# Patient Record
Sex: Male | Born: 1990 | Race: Black or African American | Hispanic: No | Marital: Single | Smoking: Never smoker
Health system: Southern US, Community
[De-identification: ages and names within clinical notes are randomized; demographics above are authoritative.]

## PROBLEM LIST (undated history)

## (undated) DIAGNOSIS — T7840XA Allergy, unspecified, initial encounter: Secondary | ICD-10-CM

## (undated) DIAGNOSIS — R011 Cardiac murmur, unspecified: Secondary | ICD-10-CM

## (undated) DIAGNOSIS — J45909 Unspecified asthma, uncomplicated: Secondary | ICD-10-CM

## (undated) HISTORY — DX: Cardiac murmur, unspecified: R01.1

## (undated) HISTORY — DX: Allergy, unspecified, initial encounter: T78.40XA

## (undated) HISTORY — DX: Unspecified asthma, uncomplicated: J45.909

---

## 2013-03-05 ENCOUNTER — Ambulatory Visit: Payer: BC Managed Care – PPO | Admitting: Physician Assistant

## 2013-03-05 ENCOUNTER — Encounter: Payer: Self-pay | Admitting: Physician Assistant

## 2013-03-05 VITALS — BP 120/68 | HR 59 | Temp 98.0°F | Resp 18 | Ht 72.0 in | Wt 171.2 lb

## 2013-03-05 DIAGNOSIS — L42 Pityriasis rosea: Secondary | ICD-10-CM

## 2013-03-05 DIAGNOSIS — R21 Rash and other nonspecific skin eruption: Secondary | ICD-10-CM

## 2013-03-05 MED ORDER — TRIAMCINOLONE ACETONIDE 0.1 % EX CREA: TOPICAL_CREAM | CUTANEOUS | Status: DC

## 2013-03-05 NOTE — Progress Notes (Signed)
Patient ID: Darrell Ross MRN: [id number], DOB: 04/27/1991, 22 y.o. Date of Encounter: 03/05/2013, 6:35 PM  Primary Physician: No primary provider on file.  Chief Complaint: Rash x 1 week  HPI: 22 y.o. male with history below presents with a 1 week history of a pruritic rash along the anterior and posterior trunk, bilateral arms, and inner thighs. Afebrile. No chills. Patient states that 1 month ago he did start a new laundry detergent, but he has since changed back to his usual laundry detergent and the rash persists. He also started using a new bath soap around 1 month ago, but he has since changed back to a known soap and the rash persists. No new medications, foods, or clothing. He did recently transfer into a new dorm. He does not recall seeing a herald spot but he states he cannot see his back very well.     Past Medical History  Diagnosis Date  . Allergy   . Asthma   . Heart murmur      Home Meds: Prior to Admission medications   Not on File    Allergies:  Allergies  Allergen Reactions  . Penicillins     History   Social History  . Marital Status: Single    Spouse Name: N/A    Number of Children: N/A  . Years of Education: N/A   Occupational History  . Not on file.   Social History Main Topics  . Smoking status: Never Smoker   . Smokeless tobacco: Not on file  . Alcohol Use: Not on file  . Drug Use: Not on file  . Sexual Activity: Not on file   Other Topics Concern  . Not on file   Social History Narrative  . No narrative on file     Review of Systems: Constitutional: negative for chills, fever, or fatigue  HEENT: positive for nasal congestion and rhinorrhea. negative for vision changes, hearing loss, ST, or sinus pressure Cardiovascular: negative for chest pain or palpitations Respiratory: negative for wheezing, shortness of breath, or cough Abdominal: negative for abdominal pain, nausea, vomiting, or diarrhea Dermatological: positive for  rash Neurologic: negative for headache   Physical Exam: Blood pressure 120/68, pulse 59, temperature 98 F (36.7 C), temperature source Oral, resp. rate 18, height 6' (1.829 m), weight 171 lb 3.2 oz (77.656 kg), SpO2 100.00%., Body mass index is 23.21 kg/(m^2). General: Well developed, well nourished, in no acute distress. Head: Normocephalic, atraumatic, eyes without discharge, sclera non-icteric, nares are without discharge.   Neck: Supple. Full ROM.  Lungs: Breathing is unlabored. Heart: Regular rate. Msk:  Strength and tone normal for age. Extremities/Skin: Warm and dry. No clubbing or cyanosis. No edema. Multiple scaling papules along the anterior trunk, posterior trunk, bilateral arms, and inner thighs. No lesions along the lower legs. No secondary infections. No lesions along the palms of the hands.  Neuro: Alert and oriented X 3. Moves all extremities spontaneously. Gait is normal. CNII-XII grossly in tact. Psych:  Responds to questions appropriately with a normal affect.   Labs: Results for orders placed in visit on 03/05/13  POCT SKIN KOH      Result Value Range   Skin KOH, POC Negative       ASSESSMENT AND PLAN:  22 y.o. male with pityriasis rosea and pruritis  -Triamcinolone topical cream 0.1% Apply to affected area bid #45 grams no RF -Benadryl prn -Education provided  Signed, Eula Listen, PA-C [facility] [place], [place] [address]  [phone] 03/05/2013 6:35 PM

## 2017-04-01 ENCOUNTER — Ambulatory Visit (HOSPITAL_COMMUNITY)
Admission: EM | Admit: 2017-04-01 | Discharge: 2017-04-01 | Disposition: A | Discharge status: Home / Self Care | Payer: BC Managed Care – PPO | Attending: Internal Medicine | Admitting: Internal Medicine

## 2017-04-01 ENCOUNTER — Encounter (HOSPITAL_COMMUNITY): Payer: Self-pay | Admitting: Emergency Medicine

## 2017-04-01 ENCOUNTER — Ambulatory Visit (INDEPENDENT_AMBULATORY_CARE_PROVIDER_SITE_OTHER): Payer: BC Managed Care – PPO

## 2017-04-01 ENCOUNTER — Other Ambulatory Visit: Payer: Self-pay

## 2017-04-01 DIAGNOSIS — M25572 Pain in left ankle and joints of left foot: Secondary | ICD-10-CM

## 2017-04-01 MED ORDER — NAPROXEN 500 MG PO TABS: 500.0000 mg | ORAL_TABLET | Freq: Two times a day (BID) | ORAL | 0 refills | Status: AC

## 2017-04-01 NOTE — ED Provider Notes (Signed)
[facility]    CSN: [id number] Arrival date & time: 04/01/17  1246     History   Chief Complaint Chief Complaint  Patient presents with  . Ankle Pain    HPI Darrell Ross is a 26 y.o. male.   26 year old male comes in for 1 week history of left ankle pain. States inverted ankle while playing basketball. He was able to ambulate after incident. Has swelling around the ankle. Continues to weight bear without problems. States has been using ice compress with good improvement of swelling. Denies numbness, tingling. Has not taken anything for the pain.       Past Medical History:  Diagnosis Date  . Allergy   . Asthma   . Heart murmur     There are no active problems to display for this patient.   History reviewed. No pertinent surgical history.     Home Medications    Prior to Admission medications   Medication Sig Start Date End Date Taking? Authorizing Provider  naproxen (NAPROSYN) 500 MG tablet Take 1 tablet (500 mg total) by mouth 2 (two) times daily for 10 days. 04/01/17 04/11/17  Belinda FisherYu, Amy V, PA-C    Family History Family History  Problem Relation Age of Onset  . Asthma Father     Social History Social History   Tobacco Use  . Smoking status: Never Smoker  Substance Use Topics  . Alcohol use: Not on file  . Drug use: Not on file     Allergies   Penicillins   Review of Systems Review of Systems  Reason unable to perform ROS: See HPI as above.     Physical Exam Triage Vital Signs ED Triage Vitals [04/01/17 1329]  Enc Vitals Group     BP 128/64     Pulse Rate 60     Resp 16     Temp 97.9 F (36.6 C)     Temp src      SpO2 100 %     Weight      Height      Head Circumference      Peak Flow      Pain Score 3     Pain Loc      Pain Edu?      Excl. in GC?    No data found.  Updated Vital Signs BP 128/64   Pulse 60   Temp 97.9 F (36.6 C)   Resp 16   SpO2 100%   Physical Exam  Constitutional: He is oriented  to person, place, and time. He appears well-developed and well-nourished. No distress.  HENT:  Head: Normocephalic and atraumatic.  Eyes: Conjunctivae are normal. Pupils are equal, round, and reactive to light.  Musculoskeletal:  Tenderness on palpation inferior to medial malleolus with mild swelling. Full ROM of ankle. Strength normal and equal bilaterally. Sensation intact and equal bilaterally. Pedal pulses 2+.  Neurological: He is alert and oriented to person, place, and time.     UC Treatments / Results  Labs (all labs ordered are listed, but only abnormal results are displayed) Labs Reviewed - No data to display  EKG  EKG Interpretation None       Radiology Dg Ankle Complete Left  Result Date: 04/01/2017 CLINICAL DATA:  26 year old male with injury to left ankle while playing basketball. Continued pain and swelling. EXAM: LEFT ANKLE COMPLETE - 3+ VIEW COMPARISON:  None. FINDINGS: There is no evidence of fracture, dislocation, or joint effusion.  There is no evidence of arthropathy or other focal bone abnormality. There is mild medial malleolar soft tissue swelling. Soft tissues are otherwise unremarkable. IMPRESSION: Mild medial malleolar soft tissue swelling without acute osseous abnormality. Electronically Signed   By: Sande BrothersSerena  Chacko M.D.   On: 04/01/2017 13:47    Procedures Procedures (including critical care time)  Medications Ordered in UC Medications - No data to display   Initial Impression / Assessment and Plan / UC Course  I have reviewed the triage vital signs and the nursing notes.  Pertinent labs & imaging results that were available during my care of the patient were reviewed by me and considered in my medical decision making (see chart for details).    Xray negative for fracture or dislocation. NSAIDs, ice compress, elevation. Ankle brace during activity. Follow up with PCP if symptoms not improving, resolve, worsens.   Final Clinical Impressions(s) / UC  Diagnoses   Final diagnoses:  Acute left ankle pain    ED Discharge Orders        Ordered    naproxen (NAPROSYN) 500 MG tablet  2 times daily     04/01/17 1429        Belinda FisherYu, Amy V, [place]-C 04/01/17 1436

## 2017-04-01 NOTE — Discharge Instructions (Signed)
Xray negative for fracture/dislocation. Start naproxen as directed. Ice compress and elevation of foot. Ankle brace during activity. This may take 3-4 weeks to completely resolve, but you should be feeling better each week. Follow up with PCP if symptoms not improving, worsens, does not resolve.

## 2017-04-01 NOTE — ED Triage Notes (Signed)
Pt states "I think I broke my ankle a week ago playing basketball." Pt c/o L ankle pain. Landed on ankle funny.

## 2018-06-03 IMAGING — DX DG ANKLE COMPLETE 3+V*L*
3 series · 3 of 3 positions shown · non-contrast
Comparison: None.

CLINICAL DATA: 26-year-old male with injury to left ankle while
playing basketball. Continued pain and swelling.

EXAM:
LEFT ANKLE COMPLETE - 3+ VIEW

[ankle ap]
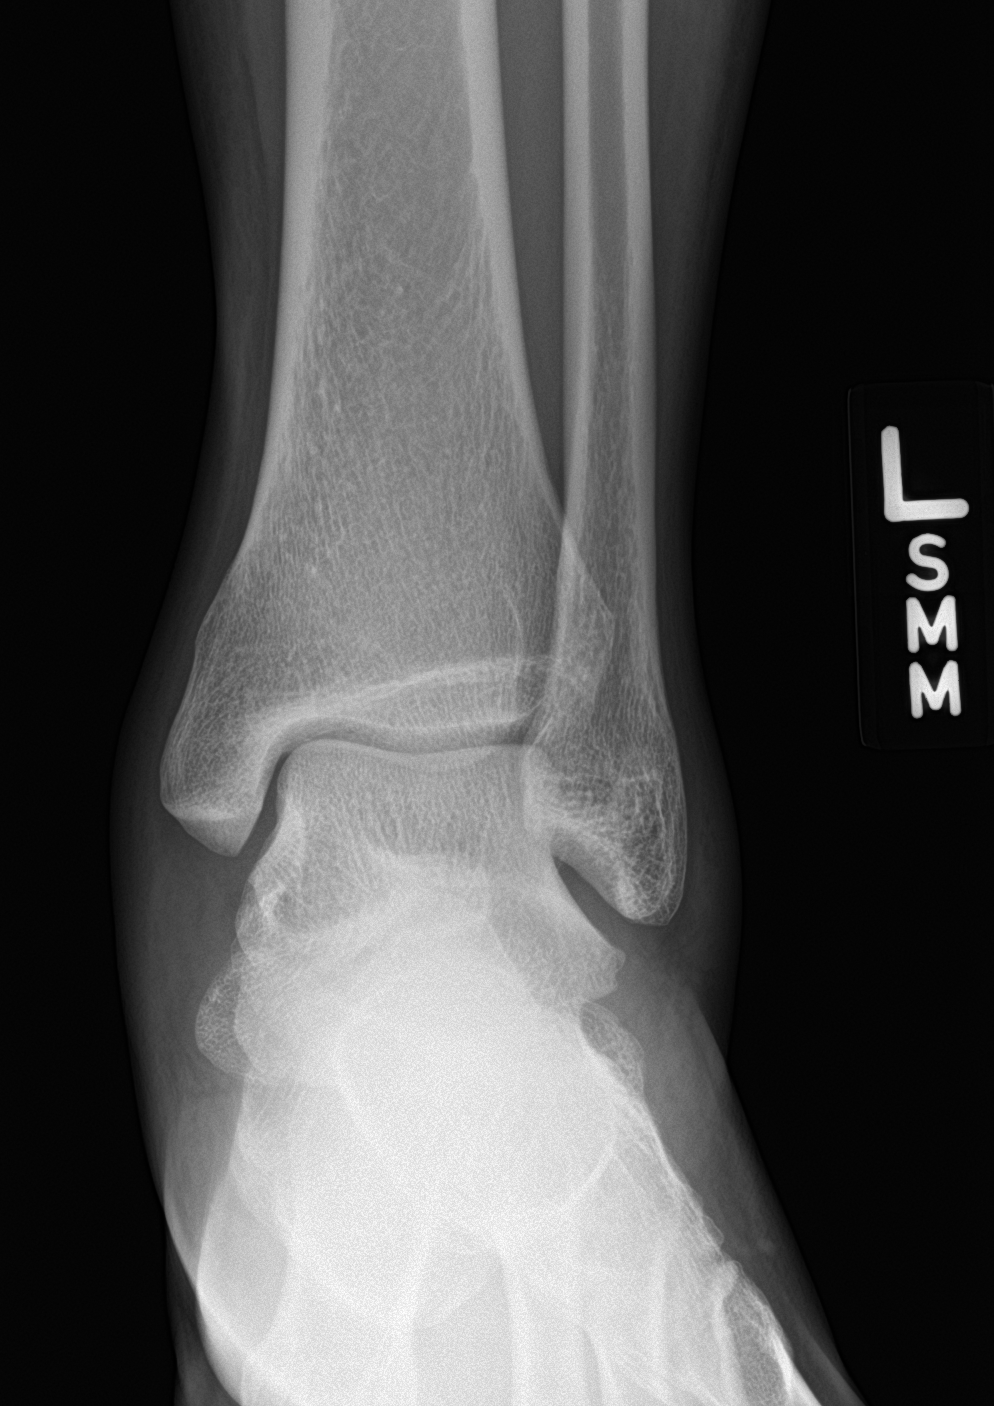

[ankle obl]
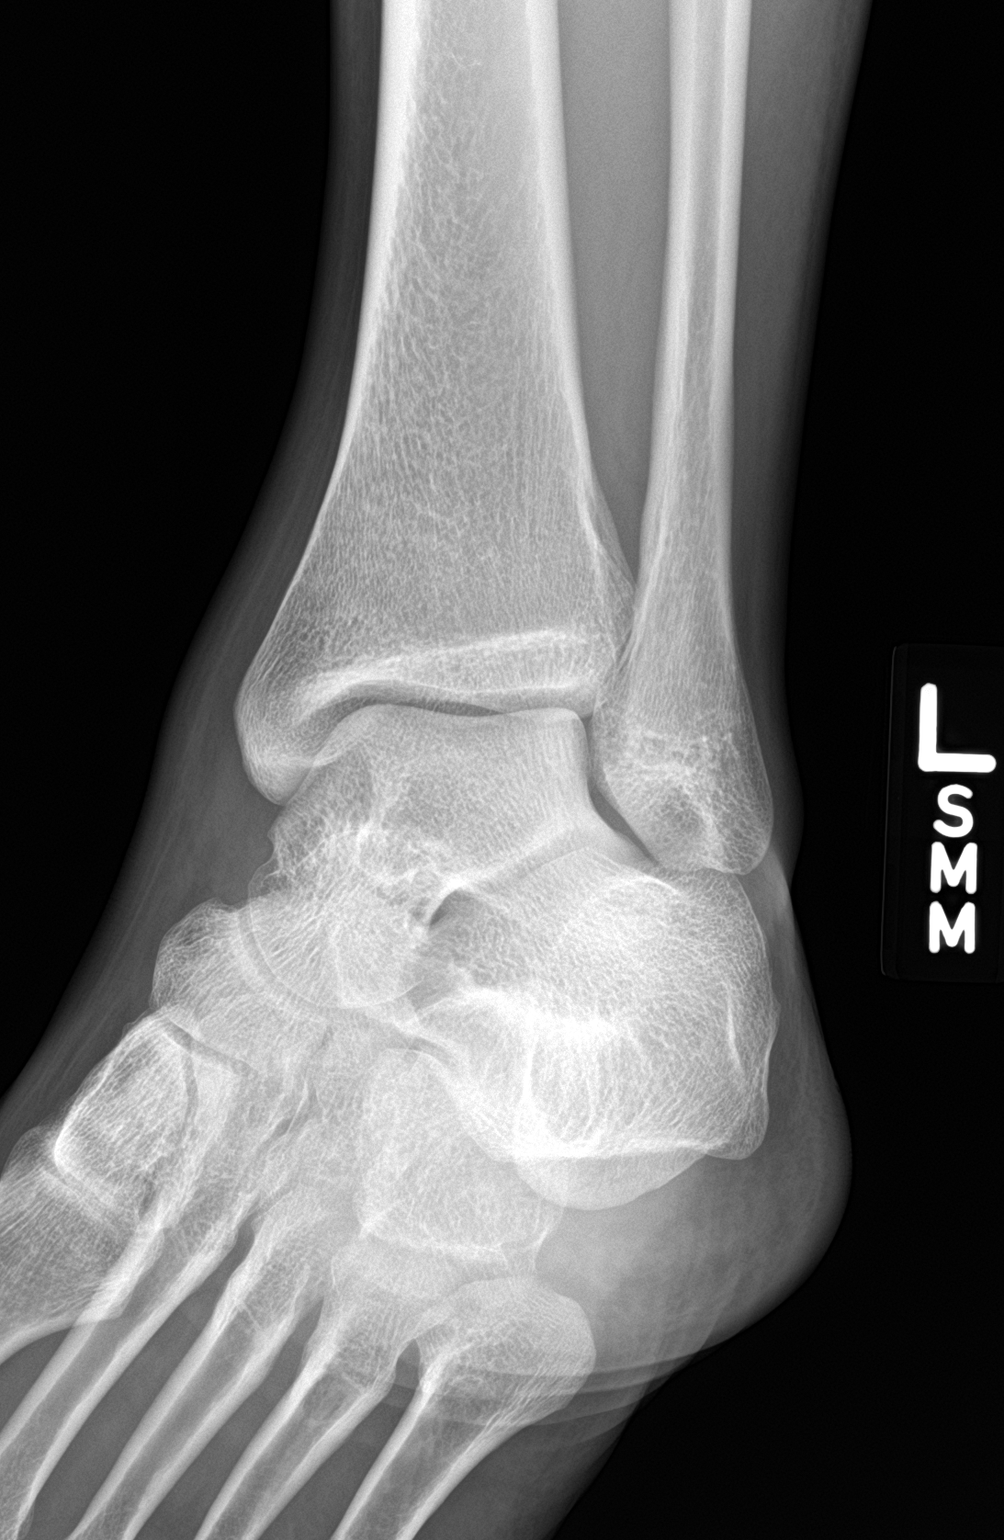

[ankle lat]
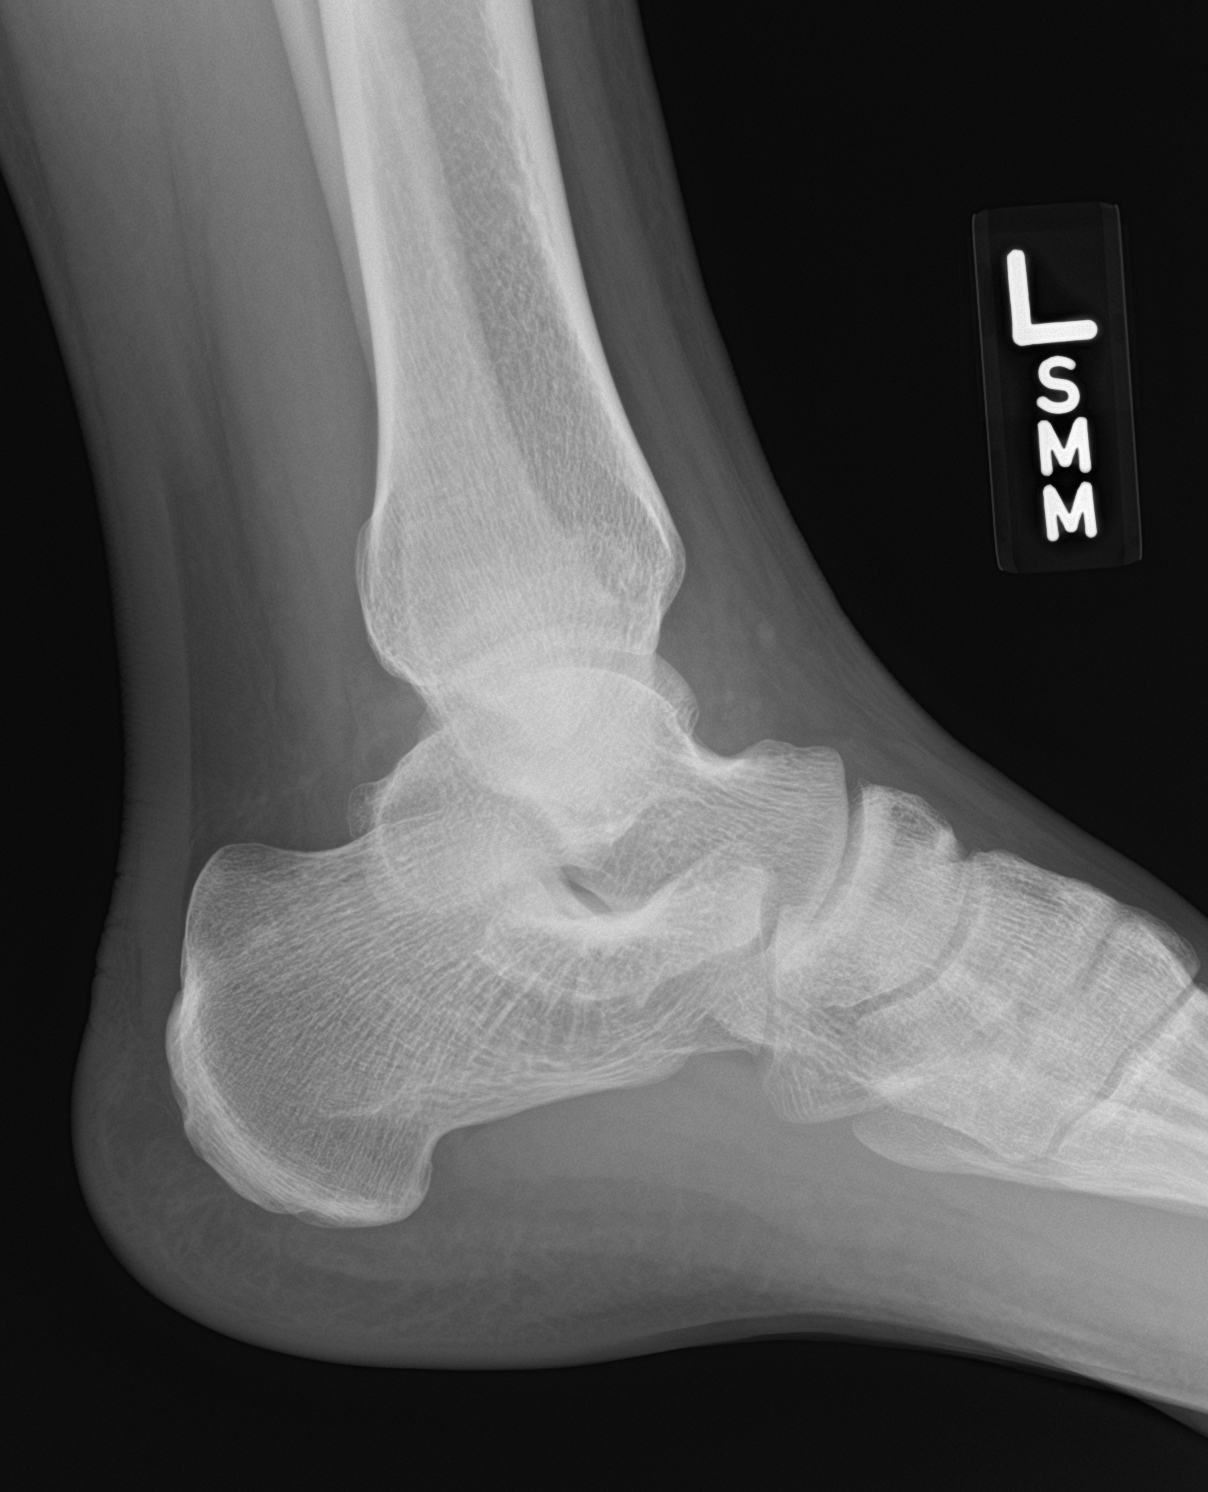

[3 of 3 positions shown; findings below may reference images not displayed]

FINDINGS: There is no evidence of fracture, dislocation, or joint effusion.
There is no evidence of arthropathy or other focal bone abnormality.

There is mild medial malleolar soft tissue swelling. Soft tissues
are otherwise unremarkable.
IMPRESSION: Mild medial malleolar soft tissue swelling without acute osseous
abnormality.

## 2023-07-21 ENCOUNTER — Emergency Department (HOSPITAL_BASED_OUTPATIENT_CLINIC_OR_DEPARTMENT_OTHER)

## 2023-07-21 ENCOUNTER — Encounter (HOSPITAL_BASED_OUTPATIENT_CLINIC_OR_DEPARTMENT_OTHER): Payer: Self-pay | Admitting: Emergency Medicine

## 2023-07-21 ENCOUNTER — Other Ambulatory Visit: Payer: Self-pay

## 2023-07-21 ENCOUNTER — Emergency Department (HOSPITAL_BASED_OUTPATIENT_CLINIC_OR_DEPARTMENT_OTHER)
Admission: EM | Admit: 2023-07-21 | Discharge: 2023-07-22 | Disposition: A | Discharge status: Home / Self Care | Attending: Emergency Medicine | Admitting: Emergency Medicine

## 2023-07-21 DIAGNOSIS — N4404 Torsion of appendix epididymis: Secondary | ICD-10-CM | POA: Insufficient documentation

## 2023-07-21 DIAGNOSIS — N50812 Left testicular pain: Secondary | ICD-10-CM | POA: Diagnosis present

## 2023-07-21 LAB — URINALYSIS, ROUTINE W REFLEX MICROSCOPIC
Bilirubin Urine: NEGATIVE
Glucose, UA: NEGATIVE mg/dL
Hgb urine dipstick: NEGATIVE
Ketones, ur: NEGATIVE mg/dL
Leukocytes,Ua: NEGATIVE
Nitrite: NEGATIVE
Protein, ur: NEGATIVE mg/dL
Specific Gravity, Urine: >=1.03 (ref 1.005–1.030)
pH: 6 (ref 5.0–8.0)

## 2023-07-21 NOTE — ED Triage Notes (Signed)
 Pt POV steady gait- c/o L sided scrotal pain since yesterday, noted swelling today. Denies discharge. Denies urinary sx.

## 2023-07-22 NOTE — ED Provider Notes (Signed)
 [facility] Provider Note   CSN: [id number] Arrival date & time: 07/21/23  1940     History  Chief Complaint  Patient presents with   Testicle Pain    Darrell Ross is a 33 y.o. male.  Patient is a 33 year old male presenting with left testicle pain since yesterday morning.  He denies any injury or trauma.  No fevers or chills.  No urinary complaints.  No aggravating or alleviating factors.  The history is provided by the patient.       Home Medications Prior to Admission medications   Not on File      Allergies    Penicillins    Review of Systems   Review of Systems  All other systems reviewed and are negative.   Physical Exam Updated Vital Signs BP 125/81   Pulse 67   Temp (!) 96.7 F (35.9 C)   Resp 15   Ht 6\' 2"  (1.88 m)   Wt 97.5 kg   SpO2 100%   BMI 27.60 kg/m  Physical Exam Vitals and nursing note reviewed.  Constitutional:      Appearance: Normal appearance.  HENT:     Head: Normocephalic and atraumatic.  Pulmonary:     Effort: Pulmonary effort is normal.  Genitourinary:    Comments: There is tenderness over the anterior aspect of the testicle.  Testicle itself is freely mobile within the scrotum. Skin:    General: Skin is warm and dry.  Neurological:     Mental Status: He is alert and oriented to person, place, and time.     ED Results / Procedures / Treatments   Labs (all labs ordered are listed, but only abnormal results are displayed) Labs Reviewed  URINALYSIS, ROUTINE W REFLEX MICROSCOPIC    EKG None  Radiology US SCROTUM W/DOPPLER Result Date: 07/21/2023 CLINICAL DATA:  Left testicular pain and swelling EXAM: SCROTAL ULTRASOUND DOPPLER ULTRASOUND OF THE TESTICLES TECHNIQUE: Complete ultrasound examination of the testicles, epididymis, and other scrotal structures was performed. Color and spectral Doppler ultrasound were also utilized to evaluate blood flow to the testicles.  COMPARISON:  None Available. FINDINGS: Right testicle Measurements: 5.2 x 3.3 x 2.7 cm. No mass or microlithiasis visualized. Left testicle Measurements: 4.7 x 2.7 x 3.5 cm. No mass or microlithiasis visualized. Right epididymis: Epididymal head cyst is present measuring 13 x 8 x 8 mm. Left epididymis: The left epididymis appears within normal limits. Adjacent to and superior to the left epididymal head there is a heterogeneous hypoechoic focal area measuring 1.6 x 0.6 x 1.2 cm. There is minimal vascularity in this region. This corresponds to patient's area of pain. Hydrocele:  Small bilateral hydroceles are present. Varicocele:  None visualized. Pulsed Doppler interrogation of both testes demonstrates normal low resistance arterial and venous waveforms bilaterally. IMPRESSION: 1. No evidence of testicular torsion. 2. Heterogeneous hypoechoic focal area adjacent to and superior to the left epididymal head measuring 1.6 x 0.6 x 1.2 cm. This corresponds to patient's area of pain. 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 3. Small bilateral hydroceles. 4. Right epididymal head cyst measuring 13 x 8 x 8 mm. Electronically Signed   By: Darliss Cheney M.D.   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  Patient to be treated with ice  packs, NSAIDs, rest, and follow-up as needed.  Final Clinical Impression(s) / ED Diagnoses Final diagnoses:  Testicular torsion, appendix epididymis    Rx / DC Orders ED Discharge Orders     None         Geoffery Lyons, MD 07/22/23 [phone]

## 2023-07-22 NOTE — Discharge Instructions (Signed)
 Take ibuprofen 600 mg every 6 hours as needed for pain.  Apply ice packs 20 to 30 minutes at a time every 2-3 hours for the next 2 days.  Return to the ER if you develop any new and/or concerning issues.
